# Patient Record
Sex: Female | Born: 1975 | Race: Black or African American | Hispanic: No | Marital: Single | Smoking: Never smoker
Health system: Southern US, Community
[De-identification: ages and names within clinical notes are randomized; demographics above are authoritative.]

---

## 1997-10-16 ENCOUNTER — Other Ambulatory Visit: Admission: RE | Admit: 1997-10-16 | Discharge: 1997-10-16 | Payer: Self-pay | Admitting: Obstetrics and Gynecology

## 1997-12-11 ENCOUNTER — Other Ambulatory Visit: Admission: RE | Admit: 1997-12-11 | Discharge: 1997-12-11 | Payer: Self-pay | Admitting: Obstetrics and Gynecology

## 1998-01-03 ENCOUNTER — Inpatient Hospital Stay (HOSPITAL_COMMUNITY): Admission: AD | Admit: 1998-01-03 | Discharge: 1998-01-05 | Payer: Self-pay | Admitting: Obstetrics and Gynecology

## 1999-07-15 ENCOUNTER — Other Ambulatory Visit: Admission: RE | Admit: 1999-07-15 | Discharge: 1999-07-15 | Payer: Self-pay | Admitting: *Deleted

## 1999-10-21 ENCOUNTER — Other Ambulatory Visit: Admission: RE | Admit: 1999-10-21 | Discharge: 1999-10-21 | Payer: Self-pay | Admitting: Family Medicine

## 1999-12-17 ENCOUNTER — Other Ambulatory Visit: Admission: RE | Admit: 1999-12-17 | Discharge: 1999-12-17 | Payer: Self-pay | Admitting: *Deleted

## 1999-12-18 ENCOUNTER — Other Ambulatory Visit: Admission: RE | Admit: 1999-12-18 | Discharge: 1999-12-18 | Payer: Self-pay | Admitting: *Deleted

## 1999-12-18 ENCOUNTER — Encounter (INDEPENDENT_AMBULATORY_CARE_PROVIDER_SITE_OTHER): Payer: Self-pay

## 2000-01-16 ENCOUNTER — Ambulatory Visit (HOSPITAL_COMMUNITY): Admission: RE | Admit: 2000-01-16 | Discharge: 2000-01-16 | Payer: Self-pay | Admitting: Obstetrics and Gynecology

## 2000-01-16 ENCOUNTER — Encounter (INDEPENDENT_AMBULATORY_CARE_PROVIDER_SITE_OTHER): Payer: Self-pay

## 2001-02-09 ENCOUNTER — Inpatient Hospital Stay (HOSPITAL_COMMUNITY): Admission: AD | Admit: 2001-02-09 | Discharge: 2001-02-09 | Payer: Self-pay | Admitting: Obstetrics

## 2001-03-08 ENCOUNTER — Encounter: Admission: RE | Admit: 2001-03-08 | Discharge: 2001-03-08 | Payer: Self-pay | Admitting: Obstetrics & Gynecology

## 2002-02-12 ENCOUNTER — Inpatient Hospital Stay (HOSPITAL_COMMUNITY): Admission: AD | Admit: 2002-02-12 | Discharge: 2002-02-12 | Payer: Self-pay | Admitting: Obstetrics and Gynecology

## 2002-10-13 ENCOUNTER — Other Ambulatory Visit: Admission: RE | Admit: 2002-10-13 | Discharge: 2002-10-13 | Payer: Self-pay | Admitting: Obstetrics & Gynecology

## 2003-03-05 ENCOUNTER — Inpatient Hospital Stay (HOSPITAL_COMMUNITY): Admission: AD | Admit: 2003-03-05 | Discharge: 2003-03-07 | Payer: Self-pay | Admitting: Obstetrics and Gynecology

## 2004-03-08 ENCOUNTER — Emergency Department (HOSPITAL_COMMUNITY): Admission: EM | Admit: 2004-03-08 | Discharge: 2004-03-08 | Payer: Self-pay | Admitting: *Deleted

## 2007-01-03 ENCOUNTER — Emergency Department (HOSPITAL_COMMUNITY): Admission: EM | Admit: 2007-01-03 | Discharge: 2007-01-03 | Payer: Self-pay | Admitting: Emergency Medicine

## 2008-12-03 ENCOUNTER — Emergency Department (HOSPITAL_COMMUNITY): Admission: EM | Admit: 2008-12-03 | Discharge: 2008-12-03 | Payer: Self-pay | Admitting: Family Medicine

## 2010-03-21 ENCOUNTER — Emergency Department (HOSPITAL_COMMUNITY): Admission: EM | Admit: 2010-03-21 | Discharge: 2010-03-21 | Payer: Self-pay | Admitting: Family Medicine

## 2010-09-04 LAB — URINE CULTURE: Culture  Setup Time: 209508270030

## 2010-09-04 LAB — POCT URINALYSIS DIPSTICK
Bilirubin Urine: NEGATIVE
Glucose, UA: NEGATIVE mg/dL
Ketones, ur: NEGATIVE mg/dL

## 2010-09-04 LAB — POCT PREGNANCY, URINE: Preg Test, Ur: NEGATIVE

## 2010-11-07 NOTE — Op Note (Signed)
[facility]  Patient:    NEIL, ERRICKSON                    MRN: [id number] Proc. Date: 01/16/00 Adm. Date:  [id number] Disc. Date: [id number] Attending:  Maxie Better                           Operative Report  PREOPERATIVE DIAGNOSIS:       Elective termination.  POSTOPERATIVE DIAGNOSIS:      Elective termination.  OPERATION:                    Suction dilation evacuation.  SURGEON:                      Sheronette A. Cherly Hensen, M.D.  ASSISTANT:  ANESTHESIA:                   IV sedation, paracervical block.  INDICATIONS:                  This is a 35 year old gravida 4, para 2-0-1-2 female.  Last menstrual period was November 28, 1999.  She is currently about eight weeks pregnant and desires termination of her pregnancy.  Risks and benefits of the procedure had been explained to the patient and consent was signed. The patient also desires to start Depo-Provera for contraception.  DESCRIPTION OF PROCEDURE:     Under adequate monitored anesthesia, the patient was placed in the dorsal lithotomy position. Examination under anesthesia revealed an anteverted uterus about eight to nine weeks size.  No adnexal masses are palpable.  The patient was sterilely prepped and draped in the usual fashion.  The bladder was catheterized with a small amount of urine. ________ speculum was placed in the vagina.  The cervix was noted to have a fishmouth appearance.  No vaginal lesions noted.  A ring forceps was used to grasp the anterior lip of the cervix.  Then 20 cc total of 1% Nesacaine was injected paracervically at the 3 and 9 oclock. The cervix was then serially dilated up to #31 [facility] dilator and a #8 suction curved cannula was introduced into the uterine cavity without difficulty.  A large amount of products of conception was obtained.  The uterine cavity was then curetted and resuctioned.  This procedure was performed until the uterine cavity was thought  to be clean and all instruments were then removed.  Specimen labeled products of conception were sent to pathology.  The estimated blood loss is minimal.  The maternal blood type is A+.  There were no complications. The patient tolerated the procedure well and was transferred to the recovery room in stable condition. DD:  01/16/00 TD:  01/19/00 Job: [id number]

## 2011-02-04 IMAGING — CR DG ANKLE COMPLETE 3+V*R*
3 series · 3 of 3 positions shown · non-contrast
Comparison: None

CLINICAL DATA: Swollen ankle/stepped on for a several weeks ago

RIGHT ANKLE - COMPLETE 3+ VIEW

[view not recorded (1 of 3)]
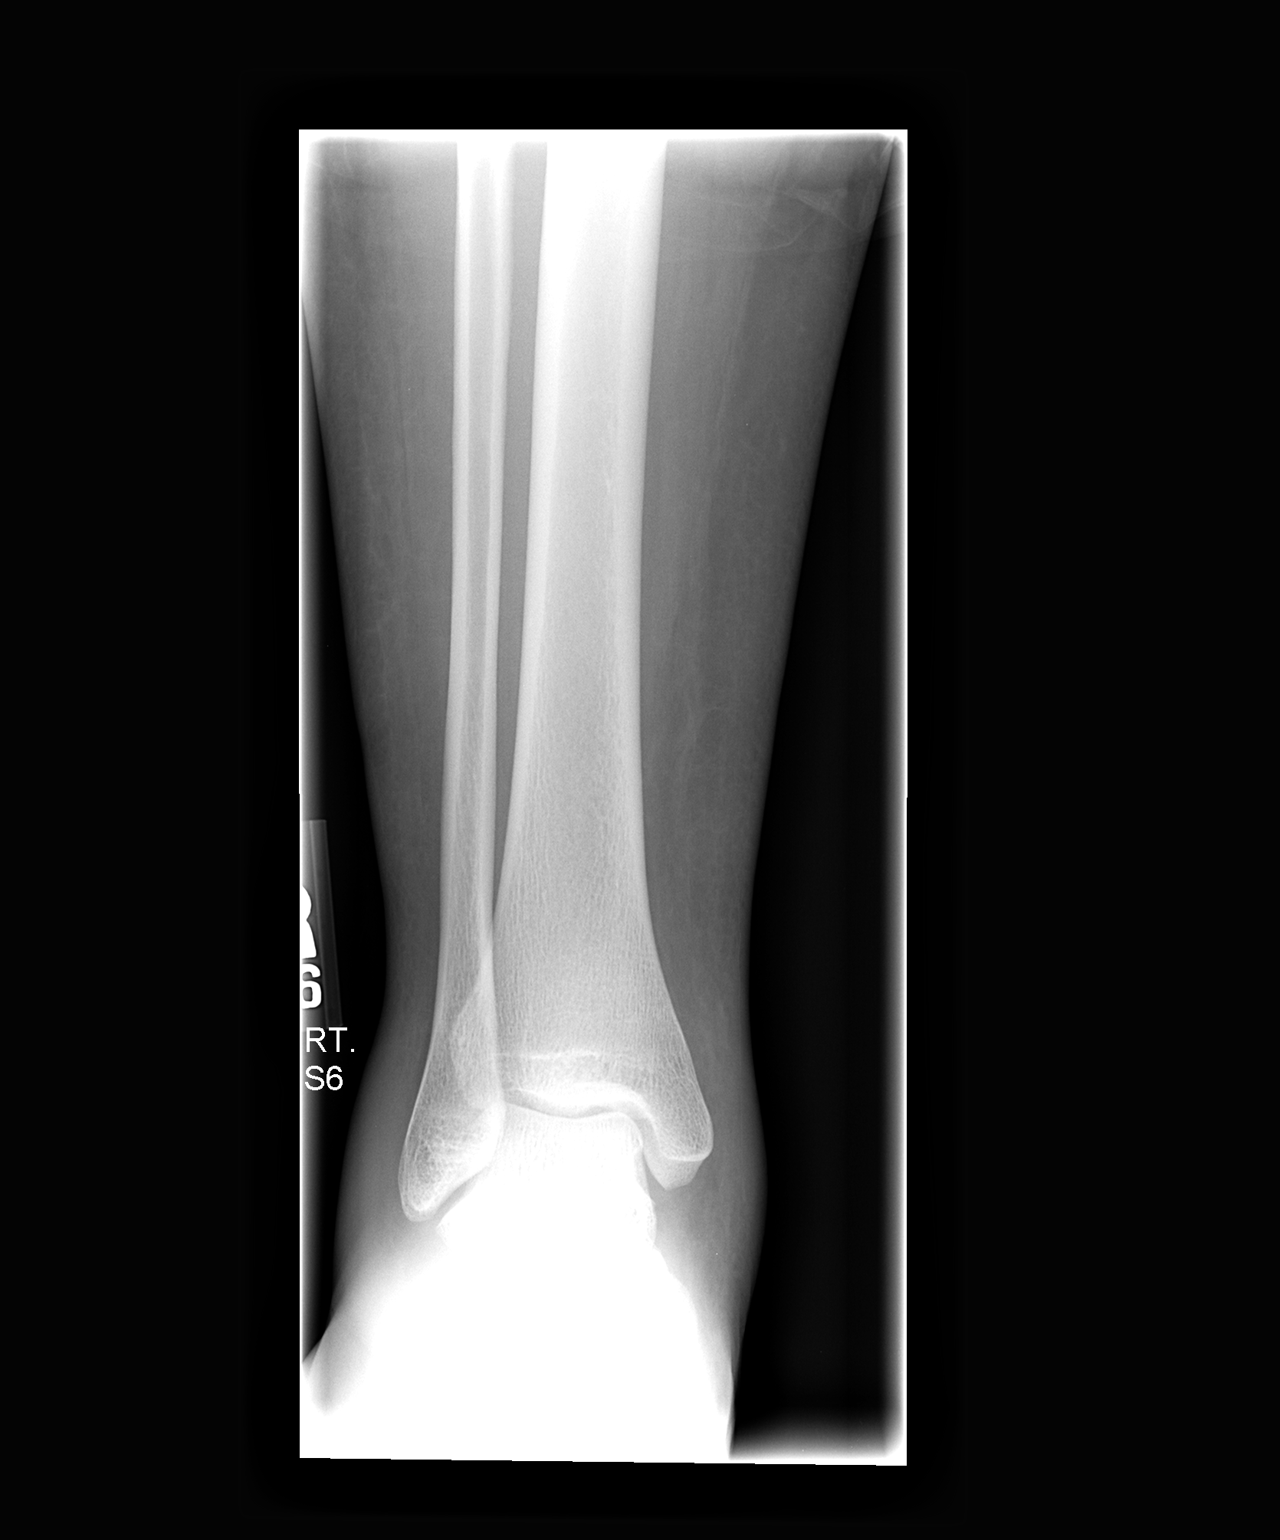

[view not recorded (2 of 3)]
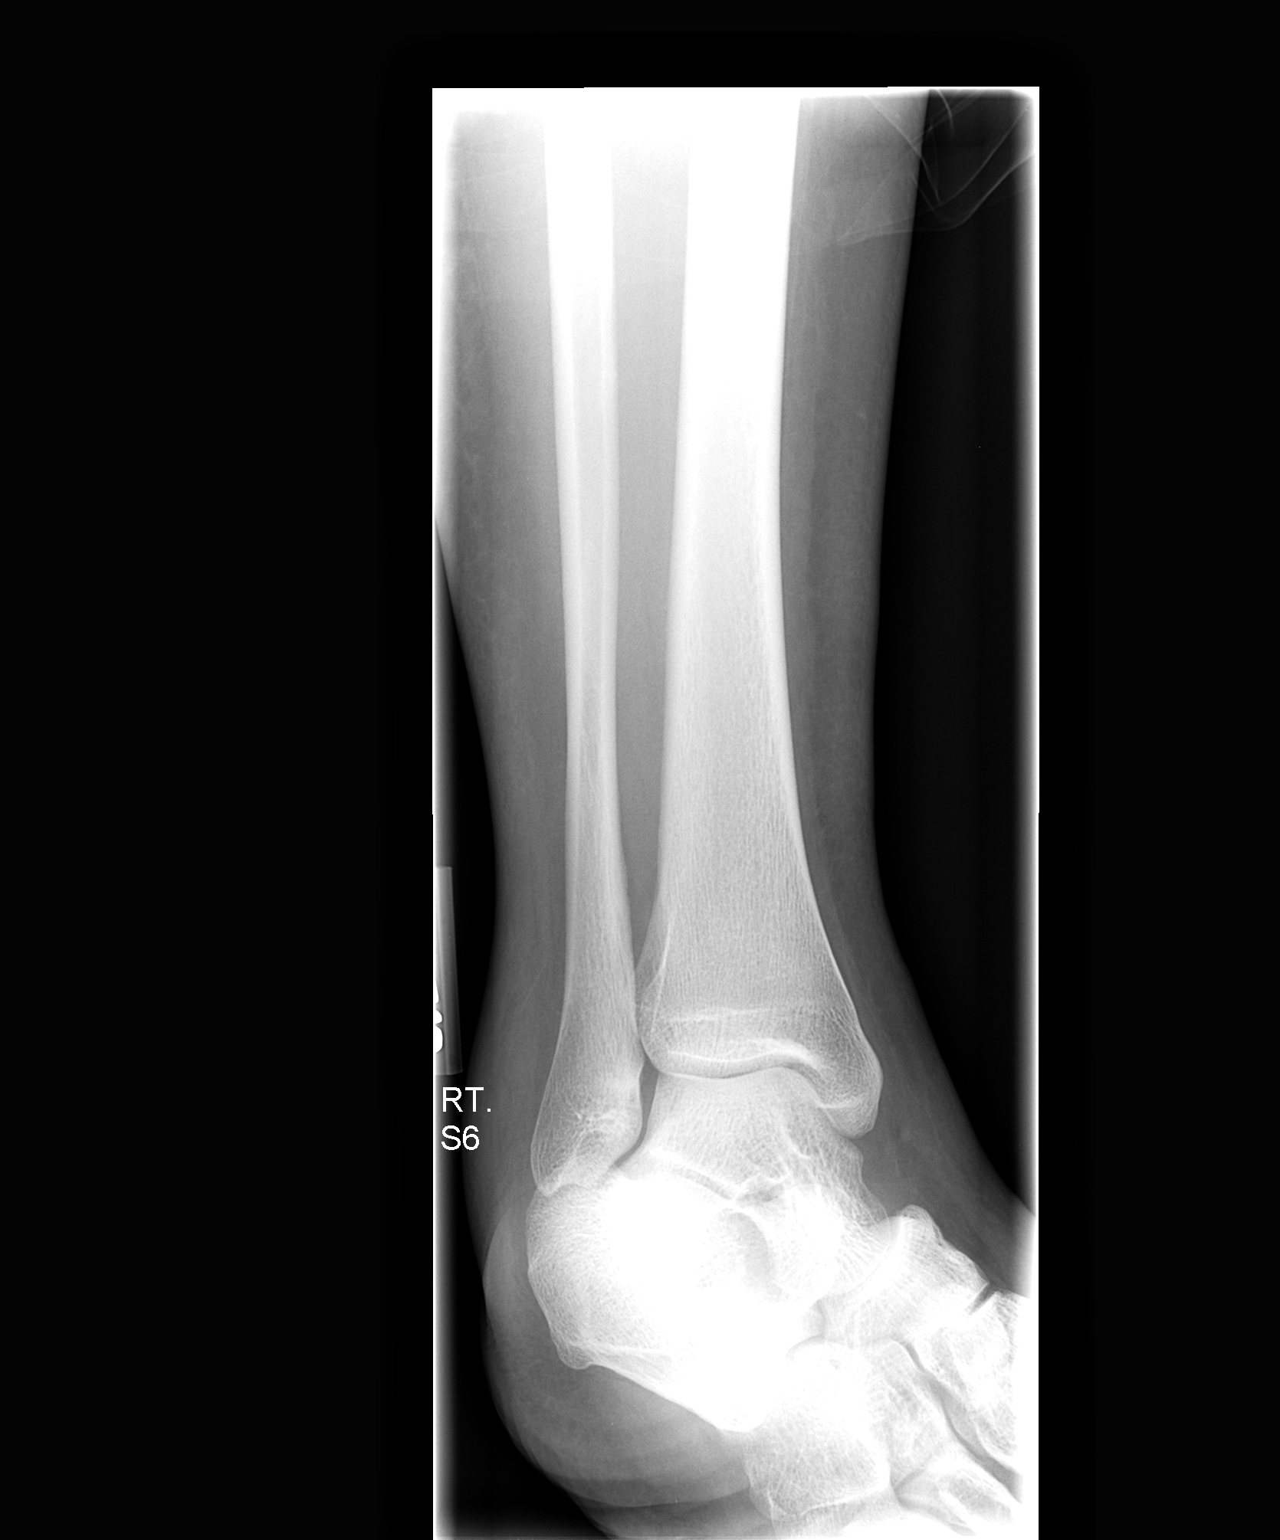

[view not recorded (3 of 3)]
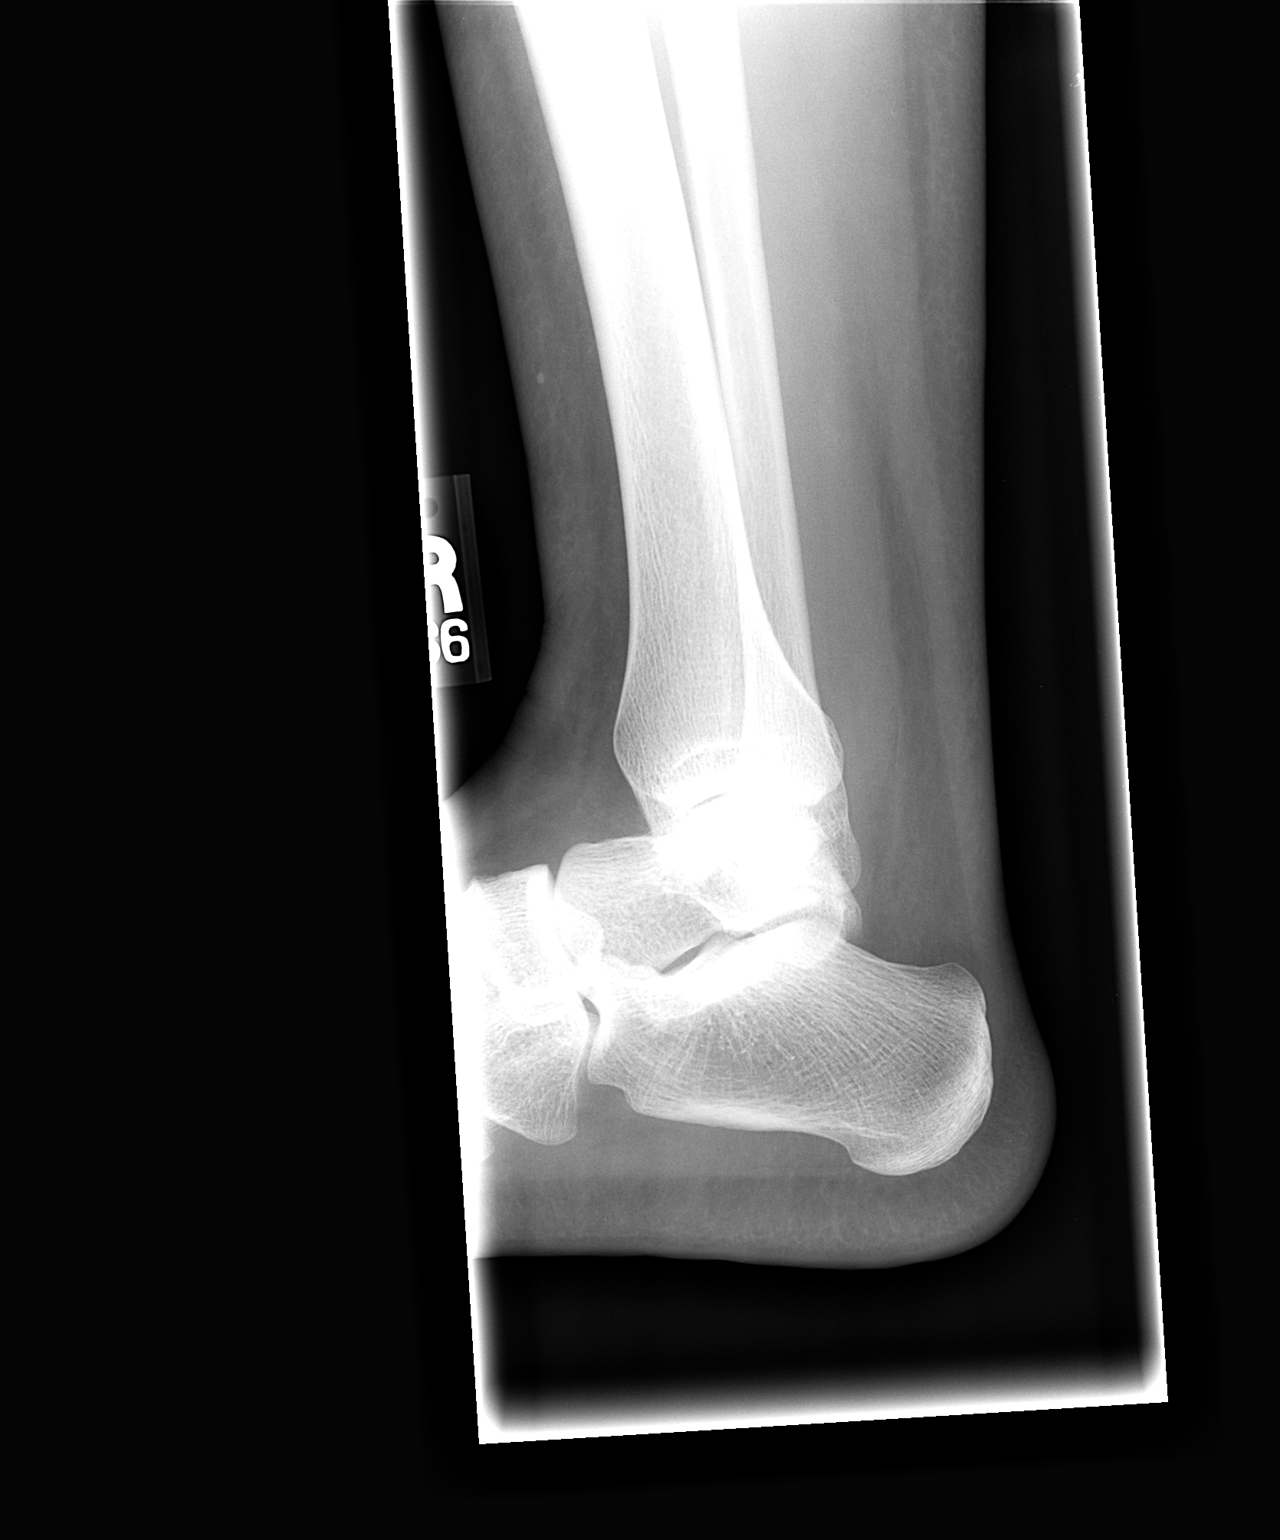

[3 of 3 positions shown; findings below may reference images not displayed]

FINDINGS: There is generalized soft tissue swelling with no gas or
foreign body.  Osseous structures and ankle joint normal.
IMPRESSION: Normal except for generalized soft tissue swelling.

## 2012-06-22 NOTE — L&D Delivery Note (Signed)
Attestation of Attending Supervision of Advanced Practitioner (PA/CNM/NP): Evaluation and management procedures were performed by the Advanced Practitioner under my supervision and collaboration.  I have reviewed the Advanced Practitioner's note and chart, and I agree with the management and plan.  Kayna Suppa, MD, FACOG Attending Obstetrician & Gynecologist Faculty Practice, [facility]  

## 2012-06-22 NOTE — L&D Delivery Note (Signed)
Delivery Note  At  0720 a viable female was delivered via  (Presentation: LOA ).  APGAR:9/9  ; weight pending.  10U pitocin was given IM.  The placenta separated spontaneously and delivered via CCT and maternal pushing effort.  It was inspected and appears to be intact with a 3 VC.  There were the following complications:   Anesthesia: none Episiotomy: none Lacerations: none Suture Repair: n/a Est. Blood Loss (mL): 300  Mom to postpartum.  Baby to Couplet care / Skin to Skin.  Delivery by Dr Corrin Parker under my supervision

## 2012-11-28 ENCOUNTER — Encounter (HOSPITAL_COMMUNITY): Payer: Self-pay | Admitting: Family

## 2012-11-28 LAB — OB RESULTS CONSOLE ABO/RH

## 2012-11-28 LAB — OB RESULTS CONSOLE GC/CHLAMYDIA
Chlamydia: NEGATIVE
Gonorrhea: NEGATIVE

## 2012-11-28 LAB — OB RESULTS CONSOLE RPR: RPR: NONREACTIVE

## 2012-11-28 LAB — OB RESULTS CONSOLE HIV ANTIBODY (ROUTINE TESTING): HIV: NONREACTIVE

## 2012-12-02 ENCOUNTER — Other Ambulatory Visit (HOSPITAL_COMMUNITY): Payer: Self-pay | Admitting: Family

## 2012-12-02 DIAGNOSIS — O09529 Supervision of elderly multigravida, unspecified trimester: Secondary | ICD-10-CM

## 2012-12-02 DIAGNOSIS — Z3682 Encounter for antenatal screening for nuchal translucency: Secondary | ICD-10-CM

## 2012-12-06 ENCOUNTER — Ambulatory Visit (HOSPITAL_COMMUNITY)
Admission: RE | Admit: 2012-12-06 | Discharge: 2012-12-06 | Disposition: A | Discharge status: Home / Self Care | Payer: Medicaid Other | Source: Ambulatory Visit | Attending: Family | Admitting: Family

## 2012-12-06 ENCOUNTER — Encounter (HOSPITAL_COMMUNITY): Payer: Self-pay

## 2012-12-06 ENCOUNTER — Other Ambulatory Visit: Payer: Self-pay

## 2012-12-06 VITALS — BP 106/63 | HR 97 | Wt 176.0 lb

## 2012-12-06 DIAGNOSIS — Z3682 Encounter for antenatal screening for nuchal translucency: Secondary | ICD-10-CM

## 2012-12-06 DIAGNOSIS — O09529 Supervision of elderly multigravida, unspecified trimester: Secondary | ICD-10-CM

## 2012-12-06 DIAGNOSIS — O09521 Supervision of elderly multigravida, first trimester: Secondary | ICD-10-CM

## 2012-12-06 DIAGNOSIS — Z3689 Encounter for other specified antenatal screening: Secondary | ICD-10-CM | POA: Insufficient documentation

## 2012-12-06 DIAGNOSIS — O351XX0 Maternal care for (suspected) chromosomal abnormality in fetus, not applicable or unspecified: Secondary | ICD-10-CM | POA: Insufficient documentation

## 2012-12-06 DIAGNOSIS — O3510X0 Maternal care for (suspected) chromosomal abnormality in fetus, unspecified, not applicable or unspecified: Secondary | ICD-10-CM | POA: Insufficient documentation

## 2012-12-06 NOTE — Progress Notes (Signed)
Genetic Counseling  High-Risk Gestation Note  Appointment Date:  12/06/2012 Referred By: Jerrell Belfast, FNP Date of Birth:  06-30-1975     Pregnancy History: [id number] Estimated Date of Delivery: 06/17/13 Estimated Gestational Age: [redacted]w[redacted]d Attending: Particia Nearing, MD   Kylie Harvey was seen for genetic counseling because of a maternal age of 37 y.o.. She was accompanied by her friend today.      She was counseled regarding maternal age and the association with risk for chromosome conditions due to nondisjunction with aging of the ova.   We reviewed chromosomes, nondisjunction, and the associated 1 in 114 risk for fetal aneuploidy related to a maternal age of 37 y.o. at [redacted]w[redacted]d gestation.  She was counseled that the risk for aneuploidy decreases as gestational age increases, accounting for those pregnancies which spontaneously abort.  We specifically discussed Down syndrome (trisomy 30), trisomies 66 and 55, and sex chromosome aneuploidies (47,XXX and 47,XXY) including the common features and prognoses of each.   We reviewed available screening options including First Screen, Quad screen, noninvasive prenatal screening (NIPS)/cell free fetal DNA (cffDNA) testing, and detailed ultrasound.  She was counseled that screening tests are used to modify a patient's a priori risk for aneuploidy, typically based on age. This estimate provides a pregnancy specific risk assessment. We reviewed the benefits and limitations of each option. Specifically, we discussed the conditions for which each test screens, the detection rates, and false positive rates of each. She was also counseled regarding diagnostic testing via CVS and amniocentesis. We reviewed the approximate 1 in 100 risk for complications for CVS and the approximate 1 in 300-500 risk for complications for amniocentesis, including spontaneous pregnancy loss for both. After consideration of all the options, she elected to proceed with NIPS (Panorama)  today.  Those results will be available in 8-10 days.    She also expressed interest in pursuing a nuchal translucency ultrasound, which was performed today.  The report will be documented separately.  The patient would like to return for a detailed ultrasound at ~18+ weeks gestation.  This appointment was scheduled today. She understands that screening tests cannot rule out all birth defects or genetic syndromes. The patient was advised of this limitation and states she still does not want additional testing at this time.   Kylie Harvey was provided with written information regarding sickle cell anemia (SCA) including the carrier frequency and incidence in the African-American population, the availability of carrier testing and prenatal diagnosis if indicated.  In addition, we discussed that hemoglobinopathies are routinely screened for as part of the [place] newborn screening panel.  She reported that she thinks this screening was performed at a previous OB visit but does not have results yet. We reviewed that screening via hemoglobin electrophoresis would be available, if this has not been previously performed.   Both family histories were reviewed and found to be contributory for a maternal aunt to the father of the pregnancy with mental retardation. She was described to have absent speech. 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 She reportedly does not have physical differences from relatives. Kylie Harvey was counseled that there are many different causes of intellectual disabilities including environmental, multifactorial, and genetic etiologies.  We discussed that a specific diagnosis for intellectual disability can be determined in approximately 50% of these individuals.  In the remaining 50% of individuals, a diagnosis may never be determined.  Regarding genetic causes, we discussed that chromosome aberrations  (aneuploidy, deletions, duplications, insertions, and translocations) are responsible for a small percentage of individuals with intellectual disability.  Many individuals with chromosome aberrations have additional differences, including congenital anomalies or minor dysmorphisms.  Likewise, single gene conditions are the underlying cause of intellectual delay in some families.  We discussed that many gene conditions have intellectual disability as a feature, but also often include other physical or medical differences. However, we discussed that if this relative's intellectual disability was secondary to complications from a twin pregnancy, then recurrence risk for relatives would be expected to be low. We discussed that without more specific information, it is difficult to provide an accurate risk assessment.  Further genetic counseling is warranted if more information is obtained.  Kylie Harvey denied exposure to environmental toxins or chemical agents. She reported drinking alcohol prior to being aware of the pregnancy. The patient reported that she discovered the pregnancy on 11/02/12. She reported having two glasses of wine on 12/04/12 and no additional alcohol exposure.  Prenatal alcohol exposure can increase the risk for growth delays, small head size, heart defects, eye and facial differences, as well as behavior problems and learning disabilities. The risk of these to occur tends to increase with the amount of alcohol consumed. However, because there is no identified safe amount of alcohol in pregnancy, it is recommended to completely avoid alcohol in pregnancy. Given the reported amount of exposure, risk for associated effects are likely low in the current pregnancy. Additionally the patient reported smoking previously in pregnancy and discontinued smoking, once she became aware of the pregnancy on 11/02/12.  She denied significant viral illnesses during the course of her pregnancy. Her medical  and surgical histories were noncontributory.   I counseled Kylie Harvey regarding the above risks and available options.  The approximate face-to-face time with the genetic counselor was 40 minutes.  Quinn Plowman, MS,  Certified Genetic Counselor 12/06/2012

## 2012-12-15 ENCOUNTER — Telehealth (HOSPITAL_COMMUNITY): Payer: Self-pay | Admitting: MS"

## 2012-12-15 NOTE — Telephone Encounter (Signed)
Called Kylie Harvey to discuss her cell free fetal DNA test results.  Mrs. Kylie Harvey had Panorama testing through [facility].  Testing was offered because of advanced maternal age.   The patient was identified by name and DOB.  We reviewed that these are within normal limits, showing a less than 1 in 10,000 risk for trisomies 21, 18 and 13, and monosomy X (Turner syndrome).  In addition, the risk for triploidy/vanishing twin and sex chromosome trisomies (47,XXX and 47,XXY) was also low risk.  We reviewed that this testing identifies > 99% of pregnancies with trisomy 47, trisomy 95, trisomy 59, sex chromosome trisomies (47,XXX and 47,XXY), and triploidy.  The detection rate for monosomy X is ~92%.  The false positive rate is <0.1% for all conditions. Testing was also consistent with female gender.  The patient did wish to know gender.  She understands that this testing does not identify all genetic conditions.  All questions were answered to her satisfaction, she was encouraged to call with additional questions or concerns.  Quinn Plowman, MS Certified Genetic Counselor 12/15/2012 10:15 AM

## 2013-01-13 ENCOUNTER — Ambulatory Visit (HOSPITAL_COMMUNITY): Payer: Medicaid Other

## 2013-01-20 ENCOUNTER — Encounter (HOSPITAL_COMMUNITY): Payer: Self-pay

## 2013-01-20 ENCOUNTER — Ambulatory Visit (HOSPITAL_COMMUNITY)
Admission: RE | Admit: 2013-01-20 | Discharge: 2013-01-20 | Disposition: A | Discharge status: Home / Self Care | Payer: Medicaid Other | Source: Ambulatory Visit | Attending: Family | Admitting: Family

## 2013-01-20 DIAGNOSIS — O358XX0 Maternal care for other (suspected) fetal abnormality and damage, not applicable or unspecified: Secondary | ICD-10-CM | POA: Insufficient documentation

## 2013-01-20 DIAGNOSIS — Z363 Encounter for antenatal screening for malformations: Secondary | ICD-10-CM | POA: Insufficient documentation

## 2013-01-20 DIAGNOSIS — O09529 Supervision of elderly multigravida, unspecified trimester: Secondary | ICD-10-CM | POA: Insufficient documentation

## 2013-01-20 DIAGNOSIS — O09521 Supervision of elderly multigravida, first trimester: Secondary | ICD-10-CM

## 2013-01-20 DIAGNOSIS — Z1389 Encounter for screening for other disorder: Secondary | ICD-10-CM | POA: Insufficient documentation

## 2013-01-20 NOTE — Progress Notes (Signed)
Kylie Harvey  was seen today for an ultrasound appointment.  See full report in [facility].  Impression: Single IUP at 18 0/7 weeks Advanced maternal age - NIPS (cell free fetal DNA) low risk for aneuploidy Normal detailed fetal anatomy No markers associated with aneuploidy noted Normal amniotic fluid volume  Recommendations: Follow-up ultrasounds as clinically indicated.   Alpha Gula, MD

## 2013-04-28 LAB — OB RESULTS CONSOLE RPR: RPR: NONREACTIVE

## 2013-05-12 ENCOUNTER — Other Ambulatory Visit (HOSPITAL_COMMUNITY): Payer: Self-pay | Admitting: Family

## 2013-05-12 DIAGNOSIS — O09523 Supervision of elderly multigravida, third trimester: Secondary | ICD-10-CM

## 2013-05-19 ENCOUNTER — Ambulatory Visit (HOSPITAL_COMMUNITY): Admission: RE | Admit: 2013-05-19 | Payer: Medicaid Other | Source: Ambulatory Visit

## 2013-05-19 ENCOUNTER — Ambulatory Visit (HOSPITAL_COMMUNITY)
Admission: RE | Admit: 2013-05-19 | Discharge: 2013-05-19 | Disposition: A | Discharge status: Home / Self Care | Payer: Medicaid Other | Source: Ambulatory Visit | Attending: Family | Admitting: Family

## 2013-05-19 DIAGNOSIS — O26849 Uterine size-date discrepancy, unspecified trimester: Secondary | ICD-10-CM | POA: Insufficient documentation

## 2013-05-19 DIAGNOSIS — O09523 Supervision of elderly multigravida, third trimester: Secondary | ICD-10-CM

## 2013-05-19 DIAGNOSIS — O09529 Supervision of elderly multigravida, unspecified trimester: Secondary | ICD-10-CM | POA: Insufficient documentation

## 2013-06-09 LAB — OB RESULTS CONSOLE GBS: GBS: NEGATIVE

## 2013-06-12 ENCOUNTER — Encounter (HOSPITAL_COMMUNITY): Payer: Self-pay

## 2013-06-12 ENCOUNTER — Inpatient Hospital Stay (HOSPITAL_COMMUNITY)
Admission: AD | Admit: 2013-06-12 | Discharge: 2013-06-12 | Disposition: A | Discharge status: Home / Self Care | Payer: Medicaid Other | Source: Ambulatory Visit | Attending: Obstetrics & Gynecology | Admitting: Obstetrics & Gynecology

## 2013-06-12 DIAGNOSIS — O479 False labor, unspecified: Secondary | ICD-10-CM | POA: Insufficient documentation

## 2013-06-12 NOTE — Progress Notes (Signed)
Kylie Harvey cnm notified of negative fern, cervical exam, ctx pattern and tracing. Order to discharge home

## 2013-06-12 NOTE — MAU Note (Signed)
Patient states she went to the BR at 0400 this am and after urinating continued to have a little leaking. States it happened another time today but no active leaking and not wearing a pad. Denies bleeding and reports good fetal movement. Having some irregular contractions.

## 2013-06-14 ENCOUNTER — Inpatient Hospital Stay (HOSPITAL_COMMUNITY)
Admission: AD | Admit: 2013-06-14 | Discharge: 2013-06-15 | DRG: 775 | Disposition: A | Discharge status: Home / Self Care | Payer: Medicaid Other | Source: Ambulatory Visit | Attending: Obstetrics & Gynecology | Admitting: Obstetrics & Gynecology

## 2013-06-14 ENCOUNTER — Encounter (HOSPITAL_COMMUNITY): Payer: Self-pay | Admitting: *Deleted

## 2013-06-14 DIAGNOSIS — IMO0001 Reserved for inherently not codable concepts without codable children: Secondary | ICD-10-CM

## 2013-06-14 DIAGNOSIS — O34219 Maternal care for unspecified type scar from previous cesarean delivery: Secondary | ICD-10-CM | POA: Diagnosis present

## 2013-06-14 DIAGNOSIS — O09529 Supervision of elderly multigravida, unspecified trimester: Secondary | ICD-10-CM | POA: Diagnosis present

## 2013-06-14 LAB — CBC
HCT: 32.7 % — ABNORMAL LOW (ref 36.0–46.0)
Hemoglobin: 11.6 g/dL — ABNORMAL LOW (ref 12.0–15.0)
MCH: 32.1 pg (ref 26.0–34.0)
MCHC: 35.5 g/dL (ref 30.0–36.0)
MCV: 90.6 fL (ref 78.0–100.0)
Platelets: 243 10*3/uL (ref 150–400)
RBC: 3.61 MIL/uL — ABNORMAL LOW (ref 3.87–5.11)
WBC: 7.2 10*3/uL (ref 4.0–10.5)

## 2013-06-14 LAB — TYPE AND SCREEN
ABO/RH(D): A POS
Antibody Screen: NEGATIVE

## 2013-06-14 LAB — ABO/RH: ABO/RH(D): A POS

## 2013-06-14 MED ORDER — TETANUS-DIPHTH-ACELL PERTUSSIS 5-2.5-18.5 LF-MCG/0.5 IM SUSP: 0.5000 mL | Freq: Once | INTRAMUSCULAR | Status: DC

## 2013-06-14 MED ORDER — WITCH HAZEL-GLYCERIN EX PADS: 1.0000 "application " | MEDICATED_PAD | CUTANEOUS | Status: DC | PRN

## 2013-06-14 MED ORDER — FLEET ENEMA 7-19 GM/118ML RE ENEM: 1.0000 | ENEMA | RECTAL | Status: DC | PRN

## 2013-06-14 MED ORDER — MEASLES, MUMPS & RUBELLA VAC ~~LOC~~ INJ
0.5000 mL | INJECTION | Freq: Once | SUBCUTANEOUS | Status: DC
Start: 2013-06-15 — End: 2013-06-15
  Filled 2013-06-14: qty 0.5

## 2013-06-14 MED ORDER — CITRIC ACID-SODIUM CITRATE 334-500 MG/5ML PO SOLN: 30.0000 mL | ORAL | Status: DC | PRN

## 2013-06-14 MED ORDER — SIMETHICONE 80 MG PO CHEW: 80.0000 mg | CHEWABLE_TABLET | ORAL | Status: DC | PRN

## 2013-06-14 MED ORDER — ONDANSETRON HCL 4 MG/2ML IJ SOLN: 4.0000 mg | INTRAMUSCULAR | Status: DC | PRN

## 2013-06-14 MED ORDER — IBUPROFEN 600 MG PO TABS
600.0000 mg | ORAL_TABLET | Freq: Four times a day (QID) | ORAL | Status: DC | PRN
  Administered 2013-06-14: 600 mg via ORAL
  Filled 2013-06-14: qty 1

## 2013-06-14 MED ORDER — ACETAMINOPHEN 325 MG PO TABS: 650.0000 mg | ORAL_TABLET | ORAL | Status: DC | PRN

## 2013-06-14 MED ORDER — OXYTOCIN 40 UNITS IN LACTATED RINGERS INFUSION - SIMPLE MED: 62.5000 mL/h | INTRAVENOUS | Status: DC

## 2013-06-14 MED ORDER — DIPHENHYDRAMINE HCL 25 MG PO CAPS: 25.0000 mg | ORAL_CAPSULE | Freq: Four times a day (QID) | ORAL | Status: DC | PRN

## 2013-06-14 MED ORDER — OXYCODONE-ACETAMINOPHEN 5-325 MG PO TABS
1.0000 | ORAL_TABLET | ORAL | Status: DC | PRN
  Administered 2013-06-14 – 2013-06-15 (×2): 1 via ORAL
  Filled 2013-06-14 (×2): qty 1

## 2013-06-14 MED ORDER — SENNOSIDES-DOCUSATE SODIUM 8.6-50 MG PO TABS
2.0000 | ORAL_TABLET | ORAL | Status: DC
  Administered 2013-06-14: 2 via ORAL
  Filled 2013-06-14: qty 2

## 2013-06-14 MED ORDER — LANOLIN HYDROUS EX OINT: TOPICAL_OINTMENT | CUTANEOUS | Status: DC | PRN

## 2013-06-14 MED ORDER — ZOLPIDEM TARTRATE 5 MG PO TABS: 5.0000 mg | ORAL_TABLET | Freq: Every evening | ORAL | Status: DC | PRN

## 2013-06-14 MED ORDER — DIBUCAINE 1 % RE OINT: 1.0000 "application " | TOPICAL_OINTMENT | RECTAL | Status: DC | PRN

## 2013-06-14 MED ORDER — OXYTOCIN BOLUS FROM INFUSION: 500.0000 mL | INTRAVENOUS | Status: DC

## 2013-06-14 MED ORDER — OXYCODONE-ACETAMINOPHEN 5-325 MG PO TABS
1.0000 | ORAL_TABLET | ORAL | Status: DC | PRN
  Administered 2013-06-14: 1 via ORAL
  Filled 2013-06-14: qty 1

## 2013-06-14 MED ORDER — OXYTOCIN 40 UNITS IN LACTATED RINGERS INFUSION - SIMPLE MED: 62.5000 mL/h | INTRAVENOUS | Status: DC | PRN

## 2013-06-14 MED ORDER — ONDANSETRON HCL 4 MG/2ML IJ SOLN: 4.0000 mg | Freq: Four times a day (QID) | INTRAMUSCULAR | Status: DC | PRN

## 2013-06-14 MED ORDER — PRENATAL MULTIVITAMIN CH
1.0000 | ORAL_TABLET | Freq: Every day | ORAL | Status: DC
  Administered 2013-06-14 – 2013-06-15 (×2): 1 via ORAL
  Filled 2013-06-14 (×2): qty 1

## 2013-06-14 MED ORDER — METHYLERGONOVINE MALEATE 0.2 MG PO TABS: 0.2000 mg | ORAL_TABLET | ORAL | Status: DC | PRN

## 2013-06-14 MED ORDER — ONDANSETRON HCL 4 MG PO TABS: 4.0000 mg | ORAL_TABLET | ORAL | Status: DC | PRN

## 2013-06-14 MED ORDER — LACTATED RINGERS IV SOLN: INTRAVENOUS | Status: DC

## 2013-06-14 MED ORDER — OXYTOCIN 10 UNIT/ML IJ SOLN: 10.0000 [IU] | Freq: Once | INTRAMUSCULAR | Status: DC

## 2013-06-14 MED ORDER — LACTATED RINGERS IV SOLN: 500.0000 mL | INTRAVENOUS | Status: DC | PRN

## 2013-06-14 MED ORDER — BENZOCAINE-MENTHOL 20-0.5 % EX AERO
1.0000 "application " | INHALATION_SPRAY | CUTANEOUS | Status: DC | PRN
  Filled 2013-06-14: qty 56

## 2013-06-14 MED ORDER — FERROUS SULFATE 325 (65 FE) MG PO TABS
325.0000 mg | ORAL_TABLET | Freq: Two times a day (BID) | ORAL | Status: DC
  Administered 2013-06-14 – 2013-06-15 (×2): 325 mg via ORAL
  Filled 2013-06-14 (×2): qty 1

## 2013-06-14 MED ORDER — OXYTOCIN 10 UNIT/ML IJ SOLN
INTRAMUSCULAR | Status: AC
  Administered 2013-06-14: 10 [IU]
  Filled 2013-06-14: qty 2

## 2013-06-14 MED ORDER — METHYLERGONOVINE MALEATE 0.2 MG/ML IJ SOLN: 0.2000 mg | INTRAMUSCULAR | Status: DC | PRN

## 2013-06-14 MED ORDER — BISACODYL 10 MG RE SUPP: 10.0000 mg | Freq: Every day | RECTAL | Status: DC | PRN

## 2013-06-14 MED ORDER — IBUPROFEN 600 MG PO TABS
600.0000 mg | ORAL_TABLET | Freq: Four times a day (QID) | ORAL | Status: DC
  Administered 2013-06-14 – 2013-06-15 (×5): 600 mg via ORAL
  Filled 2013-06-14 (×5): qty 1

## 2013-06-14 MED ORDER — LIDOCAINE HCL (PF) 1 % IJ SOLN
30.0000 mL | INTRAMUSCULAR | Status: DC | PRN
  Filled 2013-06-14: qty 30

## 2013-06-14 MED ORDER — FLEET ENEMA 7-19 GM/118ML RE ENEM: 1.0000 | ENEMA | Freq: Every day | RECTAL | Status: DC | PRN

## 2013-06-14 NOTE — H&P (Signed)
Kylie Harvey is a 37 y.o. female [phone] with IUP at [redacted]w[redacted]d presenting for contractions startigng a fe whours ago. active fetal movement.   PNCare at [facility] since 11 wks  Prenatal History/Complications: none Past Medical History: No past medical history on file.  Past Surgical History: No past surgical history on file.  Obstetrical History: OB History   Grav Para Term Preterm Abortions TAB SAB Ect Mult Living   5    1  1   3       Social History: History   Social History  . Marital Status: Single    Spouse Name: N/A    Number of Children: N/A  . Years of Education: N/A   Social History Main Topics  . Smoking status: Never Smoker   . Smokeless tobacco: Not on file  . Alcohol Use: Not on file  . Drug Use: Yes    Special: Marijuana     Comment: +uds for marijuana 11/28/2012. patient states that she have stopped  . Sexual Activity: Not Currently    Birth Control/ Protection: None   Other Topics Concern  . Not on file   Social History Narrative  . No narrative on file    Family History: No family history on file.  Allergies: No Known Allergies  Prescriptions prior to admission  Medication Sig Dispense Refill  . Prenatal Vit-Fe Fumarate-FA (PRENATAL MULTIVITAMIN) TABS Take 1 tablet by mouth daily.          Review of Systems   Constitutional: Negative for fever, chills, weight loss, malaise/fatigue and diaphoresis.  HENT: Negative for hearing loss, ear pain, nosebleeds, congestion, sore throat, neck pain, tinnitus and ear discharge.   Eyes: Negative for blurred vision, double vision, photophobia, pain, discharge and redness.  Respiratory: Negative for cough, hemoptysis, sputum production, shortness of breath, wheezing and stridor.   Cardiovascular: Negative for chest pain, palpitations, orthopnea,  leg swelling  Gastrointestinal: Positive for abdominal pain. Negative for heartburn, nausea, vomiting, diarrhea, constipation, blood in stool Genitourinary: Negative  for dysuria, urgency, frequency, hematuria and flank pain.  Musculoskeletal: Negative for myalgias, back pain, joint pain and falls.  Skin: Negative for itching and rash.  Neurological: Negative for dizziness, tingling, tremors, sensory change, speech change, focal weakness, seizures, loss of consciousness, weakness and headaches.  Endo/Heme/Allergies: Negative for environmental allergies and polydipsia. Does not bruise/bleed easily.  Psychiatric/Behavioral: Negative for depression, suicidal ideas, hallucinations, memory loss and substance abuse. The patient is not nervous/anxious and does not have insomnia.       Last menstrual period 09/10/2012. General appearance: alert, cooperative and mild distress Lungs: clear to auscultation bilaterally Heart: regular rate and rhythm Abdomen: soft, non-tender; bowel sounds normal Extremities: Homans sign is negative, no sign of DVT DTR's 2+ Presentation: cephalic Fetal monitoringdoppler 130's, variable decels Uterine activity q2 mins C/C/Station: +1;+2 SROM in parking lot, clear fluid  Prenatal labs: ABO, Rh:   A+ Antibody:  neg Rubella:  imm RPR:   neg HBsAg:   neg HIV:   neg GBS:   neg 1 hr Glucola 131 Genetic screening  declined Anatomy US normal   No results found for this or any previous visit (from the past 24 hour(s)).  Assessment: Kylie Harvey is a 37 y.o. [id number] with an IUP at [redacted]w[redacted]d presenting for active labor  Plan: deliver   CRESENZO-DISHMAN,Kylie Harvey 06/14/2013, 7:17 AM

## 2013-06-14 NOTE — Progress Notes (Signed)
Pt arrived to room via stretcher. cnm at bs w/resident. Pt calm in control.

## 2013-06-14 NOTE — MAU Note (Signed)
Pt G5 P3 at 38.5wks leaking clear fluid x 30 min and having contractions.

## 2013-06-15 MED ORDER — IBUPROFEN 600 MG PO TABS: 600.0000 mg | ORAL_TABLET | Freq: Four times a day (QID) | ORAL | Status: DC

## 2013-06-15 NOTE — Discharge Summary (Signed)
Obstetric Discharge Summary Reason for Admission: onset of labor and rupture of membranes Prenatal Procedures: none Intrapartum Procedures: spontaneous vaginal delivery Postpartum Procedures: none Complications-Operative and Postpartum: none Hemoglobin  Date Value Range Status  06/14/2013 11.6* 12.0 - 15.0 g/dL Final     HCT  Date Value Range Status  06/14/2013 32.7* 36.0 - 46.0 % Final   Brief Hospital Course Pt was admitted on 06/14/13 for onset of labor and rupture of membranes. She progressed well and delivered a healthy female via NSVD over intact perineum. No complications postpartum. On day of discharge, she reports ambulating well, tolerating PO, urinating well, passing gas and her pain is well controlled. Her bleeding is equal to menstrual flow and decreasing. She is considering nexplanon or IUD for contraception. Breastfeeding is going well.  Physical Exam:  General: alert, cooperative and no distress Lochia: appropriate Uterine Fundus: firm Incision: n/a DVT Evaluation: No evidence of DVT seen on physical exam. Negative Homan's sign. No cords or calf tenderness. No significant calf/ankle edema.  Discharge Diagnoses: Term Pregnancy-delivered  Discharge Information: Date: 06/15/2013 Activity: pelvic rest Diet: routine Medications: PNV and Ibuprofen Condition: stable Instructions: refer to practice specific booklet Discharge to: home Follow-up Information   Follow up with [facility] [place] In 5 weeks. (Call as soon as possible to make appointment for postpartum visit in 4-6 weeks)    Contact information:   [address] [place] [address] [phone]      Newborn Data: Live born female  Birth Weight: 7 lb 3.3 oz (3269 g) APGAR: 9, 9 Parents desire outpatient circumcision. Home with mother.  Pior, Jearld Lesch 06/15/2013, 8:28 AM  I have seen and examined this patient and agree with above documentation in the resident's note.   Rulon Abide,  M.D. [facility] Fellow 06/15/2013 8:48 AM

## 2013-06-16 NOTE — Discharge Summary (Signed)
Attestation of Attending Supervision of Fellow: Evaluation and management procedures were performed by the Fellow under my supervision and collaboration.  I have reviewed the Fellow's note and chart, and I agree with the management and plan.    

## 2013-06-19 NOTE — H&P (Signed)
Attestation of Attending Supervision of Advanced Practitioner (PA/CNM/NP): Evaluation and management procedures were performed by the Advanced Practitioner under my supervision and collaboration.  I have reviewed the Advanced Practitioner's note and chart, and I agree with the management and plan.  Dakai Braithwaite, MD, FACOG Attending Obstetrician & Gynecologist Faculty Practice, [facility]  

## 2013-06-19 NOTE — Progress Notes (Signed)
Post discharge chart review completed.  

## 2014-04-23 ENCOUNTER — Encounter (HOSPITAL_COMMUNITY): Payer: Self-pay | Admitting: *Deleted

## 2015-07-21 IMAGING — US US OB FOLLOW-UP
1 series · 12 of 28 positions shown · non-contrast
Comparison: none

[Series 1: us ob follow up · 12 of 41 slices shown]
[im 2/41]
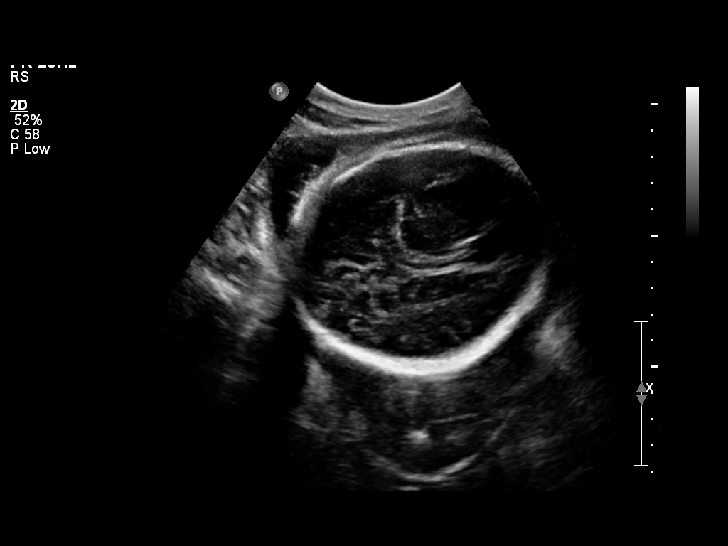
[im 5/41]
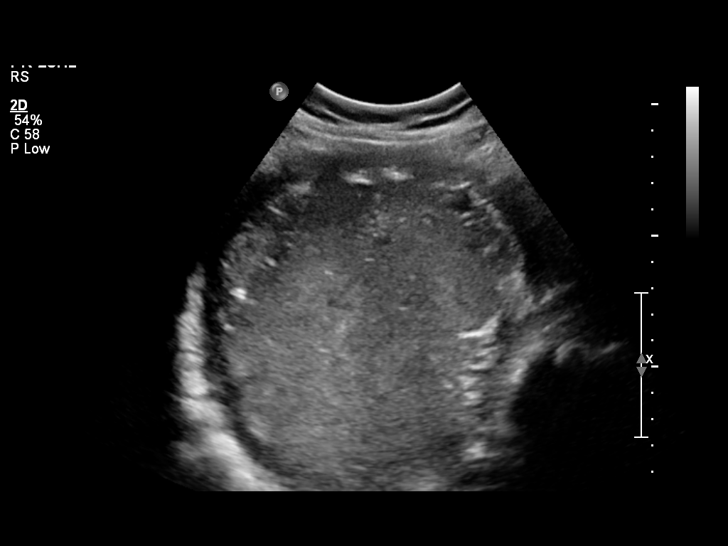
[im 8/41]
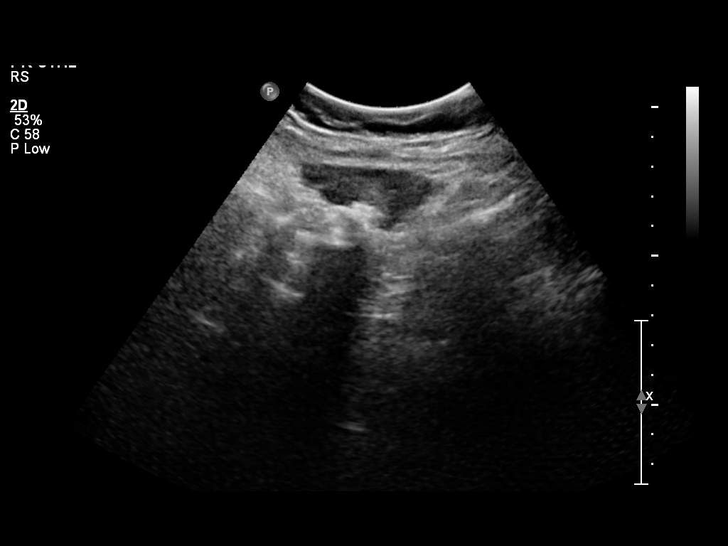
[im 12/41]
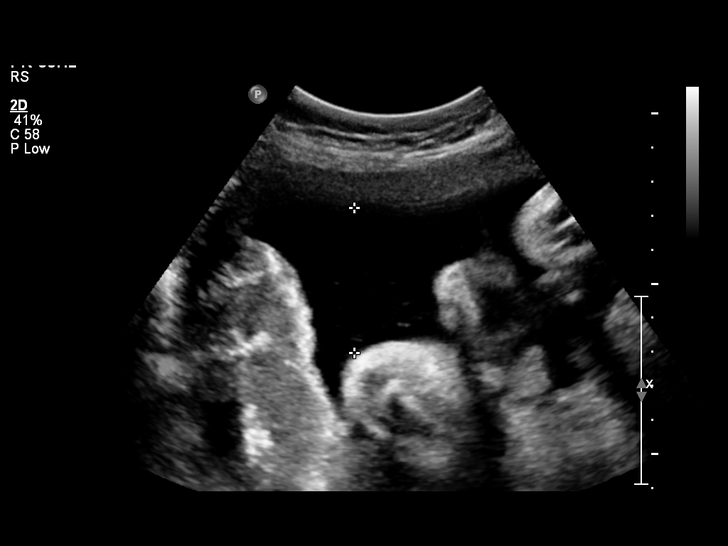
[im 15/41]
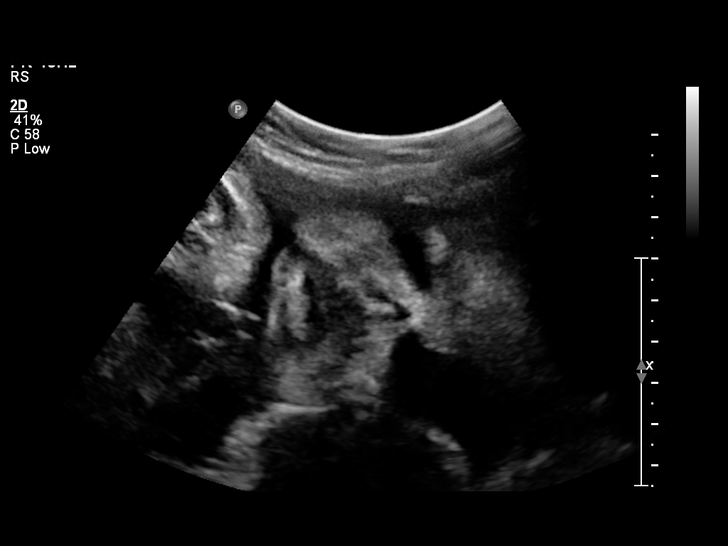
[im 18/41]
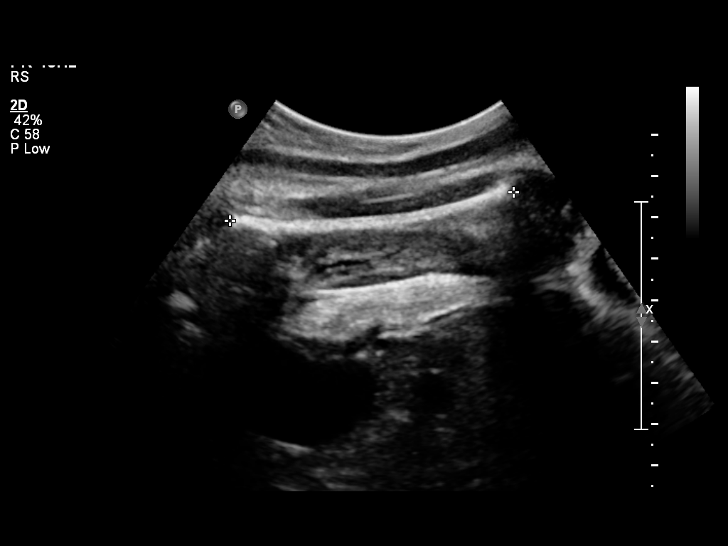
[im 23/41]
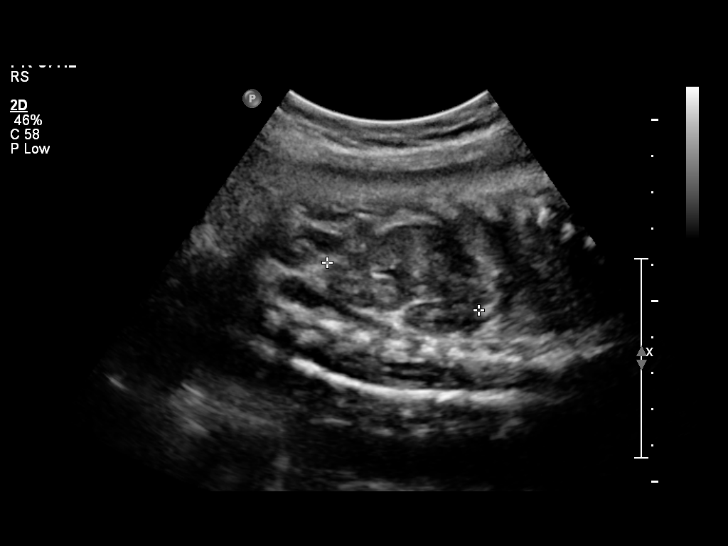
[im 26/41]
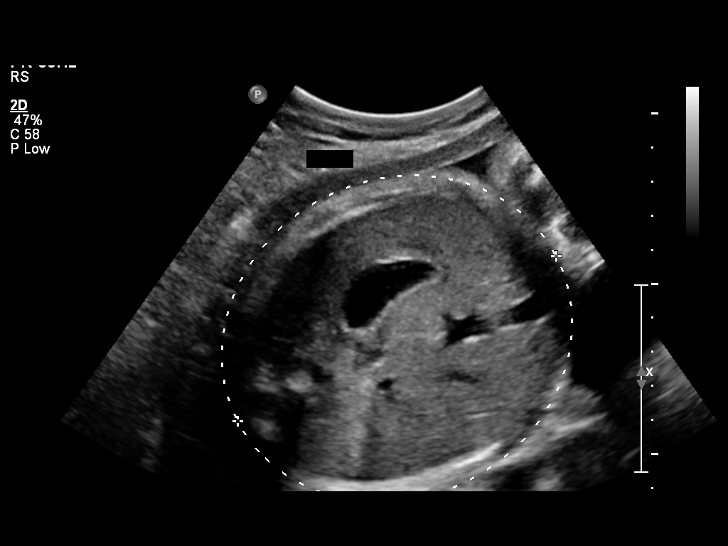
[im 29/41]
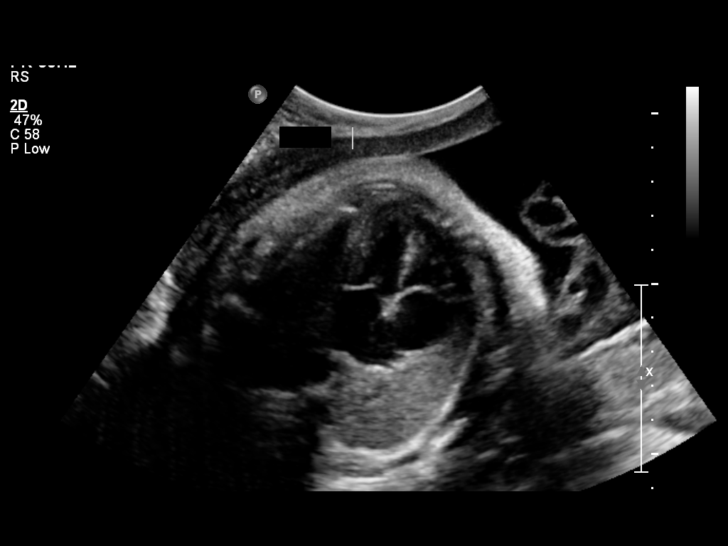
[im 33/41]
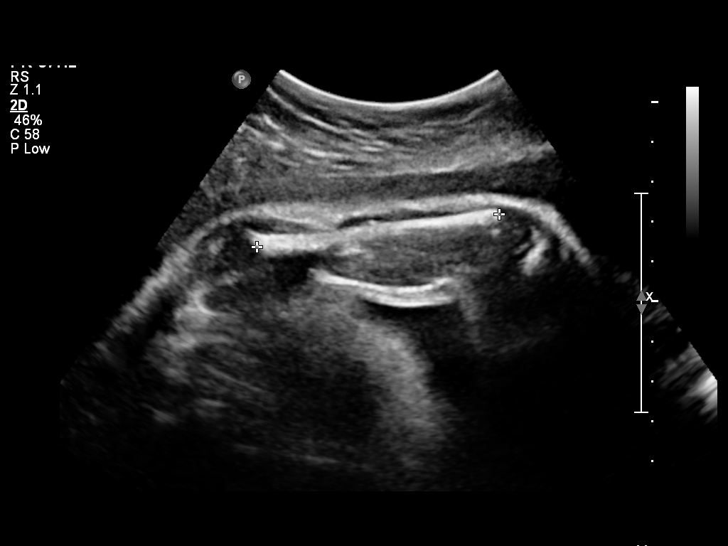
[im 36/41]
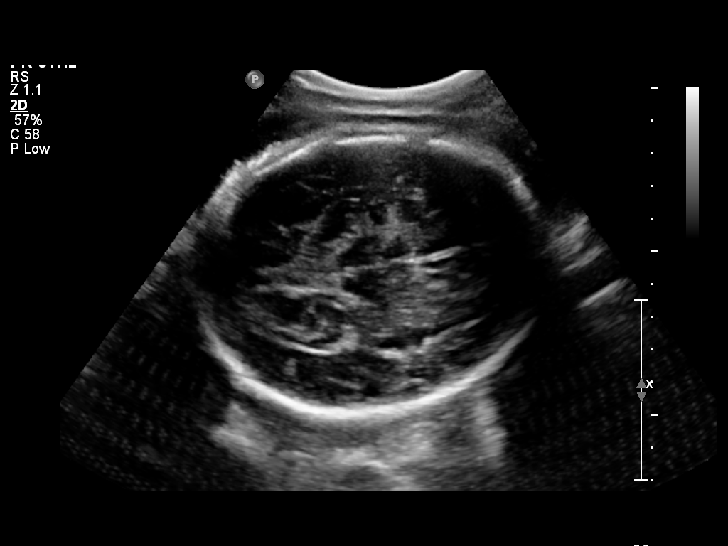
[im 39/41]
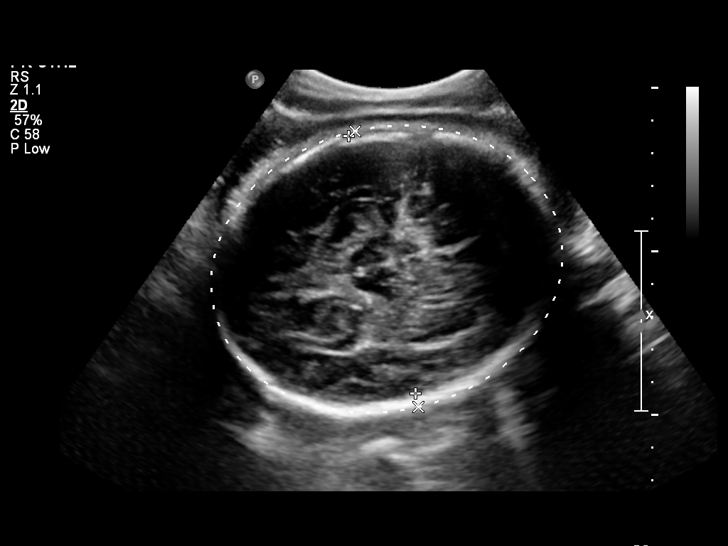

[12 of 28 positions shown; findings below may reference images not displayed]

OBSTETRICS REPORT
                      (Signed Final 05/19/2013 [DATE])

Service(s) Provided

 US OB FOLLOW UP                                       76816.1
Indications

 Advanced maternal age (AMA), Multigravida - low
 risk NIPS
 Size-Date Discrepancy
Fetal Evaluation

 Num Of Fetuses:    1
 Fetal Heart Rate:  135                          bpm
 Cardiac Activity:  Observed
 Presentation:      Cephalic
 Placenta:          Fundal, above cervical os
 P. Cord            Previously Visualized
 Insertion:

 Amniotic Fluid
 AFI FV:      Subjectively within normal limits
 AFI Sum:     15.62   cm       57  %Tile     Larg Pckt:    4.24  cm
 RUQ:   4.04    cm   RLQ:    3.32   cm    LUQ:   4.24    cm   LLQ:    4.02   cm
Biometry

 BPD:     82.3  mm     G. Age:  33w 1d                CI:        70.69   70 - 86
                                                      FL/HC:      21.8   20.1 -

 HC:       312  mm     G. Age:  34w 6d       17  %    HC/AC:      0.99   0.93 -

 AC:     314.9  mm     G. Age:  35w 3d       68  %    FL/BPD:     82.5   71 - 87
 FL:      67.9  mm     G. Age:  34w 6d       40  %    FL/AC:      21.6   20 - 24
 HUM:     61.3  mm     G. Age:  35w 3d       76  %

 Est. FW:    4173  gm    5 lb 10 oz      61  %
Gestational Age

 LMP:           35w 6d        Date:  09/10/12                 EDD:   06/17/13
 U/S Today:     34w 4d                                        EDD:   06/26/13
Anatomy
 Cranium:          Appears normal         Aortic Arch:      Previously seen
 Fetal Cavum:      Appears normal         Ductal Arch:      Previously seen
 Ventricles:       Appears normal         Diaphragm:        Appears normal
 Choroid Plexus:   Previously seen        Stomach:          Appears normal
 Cerebellum:       Previously seen        Abdomen:          Appears normal
 Posterior Fossa:  Previously seen        Abdominal Wall:   Previously seen
 Nuchal Fold:      Previously seen        Cord Vessels:     Previously seen
 Face:             Orbits previously      Kidneys:          Appear normal
                   seen, profile nml
 Lips:             Appears normal         Bladder:          Appears normal
 Heart:            Appears normal         Spine:            Previously seen
                   (4CH, axis, and
                   situs)
 RVOT:             Previously seen        Lower             Previously seen
                                          Extremities:
 LVOT:             Previously seen        Upper             Previously seen
                                          Extremities:

 Other:  Fetus appears to be a male. Heels and 5th digit previously visualized.
         Technically difficult due to fetal position.
Targeted Anatomy

 Fetal Central Nervous System
 Lat. Ventricles:
Cervix Uterus Adnexa

 Cervical Length:    3.5      cm

 Cervix:       Normal appearance by transabdominal scan.
 Uterus:       No abnormality visualized.

 Adnexa:     No abnormality visualized.
Impression

 Single IUP at 35 0/7 weeks
 Fetal growth is appropriate (61st %tile)
 Normal interval anatomy
 Fundal placenta without previa
 Normal amniotic fluid volume
Recommendations

 Follow-up ultrasounds as clinically indicated.

## 2017-04-14 ENCOUNTER — Emergency Department (HOSPITAL_COMMUNITY): Admission: EM | Admit: 2017-04-14 | Discharge: 2017-04-15 | Discharge status: Against Medical Advice | Payer: Self-pay

## 2017-04-14 NOTE — ED Notes (Signed)
Bed: WTR5 Expected date:  Expected time:  Means of arrival:  Comments: 

## 2017-04-14 NOTE — ED Triage Notes (Signed)
Pt called from triage with no answer 

## 2018-01-05 ENCOUNTER — Other Ambulatory Visit: Payer: Self-pay | Admitting: Obstetrics & Gynecology

## 2018-01-05 DIAGNOSIS — Z1231 Encounter for screening mammogram for malignant neoplasm of breast: Secondary | ICD-10-CM

## 2018-08-30 ENCOUNTER — Other Ambulatory Visit: Payer: Self-pay

## 2018-08-30 ENCOUNTER — Emergency Department (HOSPITAL_COMMUNITY)
Admission: EM | Admit: 2018-08-30 | Discharge: 2018-08-30 | Disposition: A | Discharge status: Other Acute Inpatient Hospital | Payer: 59

## 2022-11-11 ENCOUNTER — Other Ambulatory Visit: Payer: Self-pay

## 2022-11-11 ENCOUNTER — Encounter (HOSPITAL_COMMUNITY): Payer: Self-pay | Admitting: Emergency Medicine

## 2022-11-11 ENCOUNTER — Ambulatory Visit (HOSPITAL_COMMUNITY)
Admission: EM | Admit: 2022-11-11 | Discharge: 2022-11-11 | Disposition: A | Discharge status: Home / Self Care | Payer: Self-pay | Attending: Emergency Medicine | Admitting: Emergency Medicine

## 2022-11-11 DIAGNOSIS — K2900 Acute gastritis without bleeding: Secondary | ICD-10-CM

## 2022-11-11 LAB — POCT URINALYSIS DIP (MANUAL ENTRY)
Bilirubin, UA: NEGATIVE
Glucose, UA: NEGATIVE mg/dL
Ketones, POC UA: NEGATIVE mg/dL
Leukocytes, UA: NEGATIVE
Nitrite, UA: NEGATIVE
Protein Ur, POC: NEGATIVE mg/dL
Spec Grav, UA: >=1.03 — AB (ref 1.010–1.025)
Urobilinogen, UA: 0.2 E.U./dL
pH, UA: 5.5 (ref 5.0–8.0)

## 2022-11-11 LAB — POCT URINE PREGNANCY: Preg Test, Ur: NEGATIVE

## 2022-11-11 MED ORDER — PANTOPRAZOLE SODIUM 40 MG PO TBEC: 40.0000 mg | DELAYED_RELEASE_TABLET | Freq: Every day | ORAL | 0 refills | Status: DC

## 2022-11-11 NOTE — ED Triage Notes (Addendum)
Reports abdominal pain for 2 years.  This pain started 2 days ago.  Today pain has been cramp like.  Denies vomiting.  Reports diarrhea stools x 2 .   Pain is center epigastric pain Has had tylenol over the week end

## 2022-11-11 NOTE — Discharge Instructions (Addendum)
Please take the Protonix once daily, every day for the next 2 weeks.  I recommend to take first thing in the morning on an empty stomach.  Sit upright for 30 minutes after taking.  I recommend adjusting your diet to avoid spicy, greasy, fatty, caffeine, etc.  These foods may worsen your symptoms.  You can keep a food log to see which foods may upset your stomach more. Drink lots of water!!  Also avoid alcohol and medications such as ibuprofen/Advil (Tylenol is fine to take)  Please follow closely with your primary care provider.  I recommend you follow-up with a stomach specialist.  If at any point your symptoms worsen or become severe, please go directly to the emergency department.

## 2022-11-11 NOTE — ED Provider Notes (Signed)
[facility]    CSN: [id number] Arrival date & time: 11/11/22  1922      History   Chief Complaint Chief Complaint  Patient presents with   Abdominal Pain    HPI Kylie Harvey is a 47 y.o. female.  Abd pain on and off for 2 years. This episode started 2 days ago, she woke up with it. Worse the first day and then got better. Not having any symptoms or pain right now. Reports used to drink a lot but has cut down. Used to take advil a lot as well. Increased stress recently   No nausea or vomiting.  Some softer stools for the last few days.  No blood in stool, no dark or tarry stool. No fever Denies urinary symptoms  History reviewed. No pertinent past medical history.  Patient Active Problem List   Diagnosis Date Noted   Active labor 06/14/2013    History reviewed. No pertinent surgical history.  OB History     Gravida  5   Para  1   Term  1   Preterm      AB  1   Living  4      SAB  1   IAB      Ectopic      Multiple      Live Births  1            Home Medications    Prior to Admission medications   Medication Sig Start Date End Date Taking? Authorizing Provider  pantoprazole (PROTONIX) 40 MG tablet Take 1 tablet (40 mg total) by mouth daily. 11/11/22  Yes Letesha Klecker, Lurena Joiner, PA-C  ibuprofen (ADVIL,MOTRIN) 600 MG tablet Take 1 tablet (600 mg total) by mouth every 6 (six) hours. Patient not taking: Reported on 11/11/2022 06/15/13   Pior, Shanda Bumps, MD  Prenatal Vit-Fe Fumarate-FA (PRENATAL MULTIVITAMIN) TABS Take 1 tablet by mouth daily.  Patient not taking: Reported on 11/11/2022    [provider]    Family History History reviewed. No pertinent family history.  Social History Social History   Tobacco Use   Smoking status: Never  Vaping Use   Vaping Use: Never used  Substance Use Topics   Alcohol use: Not Currently   Drug use: Yes    Types: Marijuana    Comment: +uds for marijuana 11/28/2012. patient states that  she have stopped     Allergies   Patient has no known allergies.   Review of Systems Review of Systems As per HPI  Physical Exam Triage Vital Signs ED Triage Vitals  Enc Vitals Group     BP 11/11/22 1946 128/74     Pulse Rate 11/11/22 1946 79     Resp 11/11/22 1946 20     Temp 11/11/22 1946 98.9 F (37.2 C)     Temp Source 11/11/22 1946 Oral     SpO2 11/11/22 1946 97 %     Weight --      Height --      Head Circumference --      Peak Flow --      Pain Score 11/11/22 1943 0     Pain Loc --      Pain Edu? --      Excl. in GC? --    No data found.  Updated Vital Signs BP 128/74 (BP Location: Left Arm) Comment (BP Location): large cuff  Pulse 79   Temp 98.9 F (37.2 C) (Oral)   Resp 20  SpO2 97%   Visual Acuity Right Eye Distance:   Left Eye Distance:   Bilateral Distance:    Right Eye Near:   Left Eye Near:    Bilateral Near:     Physical Exam Vitals and nursing note reviewed.  Constitutional:      Appearance: Normal appearance.  HENT:     Mouth/Throat:     Mouth: Mucous membranes are moist.     Pharynx: Oropharynx is clear.  Eyes:     Conjunctiva/sclera: Conjunctivae normal.  Cardiovascular:     Rate and Rhythm: Normal rate and regular rhythm.     Heart sounds: Normal heart sounds.  Pulmonary:     Effort: Pulmonary effort is normal. No respiratory distress.     Breath sounds: Normal breath sounds.  Abdominal:     General: Bowel sounds are normal.     Palpations: Abdomen is soft.     Tenderness: There is no abdominal tenderness. There is no right CVA tenderness, left CVA tenderness, guarding or rebound.  Musculoskeletal:        General: Normal range of motion.  Skin:    General: Skin is warm and dry.  Neurological:     Mental Status: She is alert and oriented to person, place, and time.      UC Treatments / Results  Labs (all labs ordered are listed, but only abnormal results are displayed) Labs Reviewed  POCT URINALYSIS DIP (MANUAL  ENTRY) - Abnormal; Notable for the following components:      Result Value   Spec Grav, UA >=1.030 (*)    Blood, UA trace-intact (*)    All other components within normal limits  POCT URINE PREGNANCY    EKG   Radiology No results found.  Procedures Procedures (including critical care time)  Medications Ordered in UC Medications - No data to display  Initial Impression / Assessment and Plan / UC Course  I have reviewed the triage vital signs and the nursing notes.  Pertinent labs & imaging results that were available during my care of the patient were reviewed by me and considered in my medical decision making (see chart for details).  UPT negative UA elevated specific gravity. Trace RBCs Advised to increase fluids  Discussed could be gastritis vs esophagitis vs ulcer etc Protonix daily x 14 days. Diet changes, food log, lifestyle changes. Follow close with PCP and GI. Return and ED precautions   Final Clinical Impressions(s) / UC Diagnoses   Final diagnoses:  Other acute gastritis without hemorrhage     Discharge Instructions      Please take the Protonix once daily, every day for the next 2 weeks.  I recommend to take first thing in the morning on an empty stomach.  Sit upright for 30 minutes after taking.  I recommend adjusting your diet to avoid spicy, greasy, fatty, caffeine, etc.  These foods may worsen your symptoms.  You can keep a food log to see which foods may upset your stomach more. Drink lots of water!!  Also avoid alcohol and medications such as ibuprofen/Advil (Tylenol is fine to take)  Please follow closely with your primary care provider.  I recommend you follow-up with a stomach specialist.  If at any point your symptoms worsen or become severe, please go directly to the emergency department.    ED Prescriptions     Medication Sig Dispense Auth. Provider   pantoprazole (PROTONIX) 40 MG tablet Take 1 tablet (40 mg total) by mouth daily. 14  tablet Keir Viernes, Lurena Joiner, PA-C      PDMP not reviewed this encounter.

## 2023-09-25 ENCOUNTER — Encounter (HOSPITAL_COMMUNITY): Payer: Self-pay

## 2023-09-25 ENCOUNTER — Ambulatory Visit (HOSPITAL_COMMUNITY)
Admission: EM | Admit: 2023-09-25 | Discharge: 2023-09-25 | Disposition: A | Discharge status: Home / Self Care | Payer: Self-pay | Attending: Emergency Medicine | Admitting: Emergency Medicine

## 2023-09-25 DIAGNOSIS — R519 Headache, unspecified: Secondary | ICD-10-CM

## 2023-09-25 DIAGNOSIS — M6289 Other specified disorders of muscle: Secondary | ICD-10-CM

## 2023-09-25 MED ORDER — CYCLOBENZAPRINE HCL 10 MG PO TABS: 10.0000 mg | ORAL_TABLET | Freq: Two times a day (BID) | ORAL | 0 refills | Status: AC | PRN
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
## 2023-09-25 NOTE — Discharge Instructions (Addendum)
 You can take the muscle relaxer Flexeril twice daily. If the medication makes you drowsy, take only at bed time. Alternate tylenol and ibuprofen for stiffness/pain You can apply hot pad and try gentle stretching If you develop neck or shoulder pain over the next several days, follow with orthopedics
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
## 2024-05-17 ENCOUNTER — Other Ambulatory Visit: Payer: Self-pay

## 2024-05-17 ENCOUNTER — Ambulatory Visit (HOSPITAL_COMMUNITY)
Admission: EM | Admit: 2024-05-17 | Discharge: 2024-05-17 | Disposition: A | Discharge status: Home / Self Care | Payer: Self-pay | Source: Ambulatory Visit | Attending: Family Medicine | Admitting: Family Medicine

## 2024-05-17 ENCOUNTER — Encounter (HOSPITAL_COMMUNITY): Payer: Self-pay | Admitting: *Deleted

## 2024-05-17 DIAGNOSIS — K0889 Other specified disorders of teeth and supporting structures: Secondary | ICD-10-CM

## 2024-05-17 MED ORDER — HYDROCODONE-ACETAMINOPHEN 5-325 MG PO TABS: 1.0000 | ORAL_TABLET | Freq: Four times a day (QID) | ORAL | 0 refills | Status: AC | PRN

## 2024-05-17 MED ORDER — AMOXICILLIN-POT CLAVULANATE 875-125 MG PO TABS: 1.0000 | ORAL_TABLET | Freq: Two times a day (BID) | ORAL | 0 refills | Status: AC

## 2024-05-17 NOTE — ED Provider Notes (Signed)
  [facility]   [id number] 05/17/24 Arrival Time: 1437  ASSESSMENT & PLAN:  1. Pain, dental    Begin: Meds ordered this encounter  Medications   amoxicillin -clavulanate (AUGMENTIN ) 875-125 MG tablet    Sig: Take 1 tablet by mouth every 12 (twelve) hours.    Dispense:  14 tablet    Refill:  0   HYDROcodone -acetaminophen  (NORCO/VICODIN) 5-325 MG tablet    Sig: Take 1 tablet by mouth every 6 (six) hours as needed for moderate pain (pain score 4-6) or severe pain (pain score 7-10).    Dispense:  8 tablet    Refill:  0    [place] Controlled Substances Registry consulted for this patient. I feel the risk/benefit ratio today is favorable for proceeding with this prescription for a controlled substance. Medication sedation precautions given.   Follow-up Information     Kylie Harvey at [facility].   Specialty: Emergency Medicine Why: If worsening or failing to improve as anticipated. Contact information: [address] [place]  [address] [phone]               Work note provided.  Reviewed expectations re: course of current medical issues. Questions answered. Outlined signs and symptoms indicating need for more acute intervention. Patient verbalized understanding. After Visit Summary given.   SUBJECTIVE:  Kylie Harvey is a 48 y.o. female who reports dental pain; x 2 days; behind front upper left teeth. Denies bleeding/drainage. Denies fever. Tolerating PO intake. No tx PTA.   OBJECTIVE: Vitals:   05/17/24 1513  BP: (!) 141/88  Pulse: 60  Resp: 18  Temp: 98.8 F (37.1 C)  SpO2: 97%    General appearance: alert; no distress HENT: normocephalic; atraumatic; dentition: fair; TTP behind upper left front teeth; firm; without fluctuance Neck: supple without LAD; FROM; trachea midline Lungs: normal respirations; unlabored; speaks full sentences without difficulty Skin: warm and dry Psychological: alert  and cooperative; normal mood and affect  No Known Allergies  History reviewed. No pertinent past medical history. Social History   Socioeconomic History   Marital status: Single    Spouse name: Not on file   Number of children: Not on file   Years of education: Not on file   Highest education level: Not on file  Occupational History   Not on file  Tobacco Use   Smoking status: Never   Smokeless tobacco: Not on file  Vaping Use   Vaping status: Never Used  Substance and Sexual Activity   Alcohol use: Not Currently   Drug use: Yes    Types: Marijuana    Comment: +uds for marijuana 11/28/2012. patient states that she have stopped   Sexual activity: Not Currently    Birth control/protection: None  Other Topics Concern   Not on file  Social History Narrative   Not on file   Social Drivers of Health   Financial Resource Strain: Not on file  Food Insecurity: Not on file  Transportation Needs: Not on file  Physical Activity: Not on file  Stress: Not on file  Social Connections: Not on file  Intimate Partner Violence: Not on file   History reviewed. No pertinent family history. History reviewed. No pertinent surgical history.    Rolinda Rogue, MD 05/17/24 [phone]

## 2024-05-17 NOTE — Discharge Instructions (Signed)
 Be aware, you have been prescribed pain medications that may cause drowsiness. While taking this medication, do not take any other medications containing acetaminophen (Tylenol). Do not combine with alcohol or recreational drugs. Please do not drive, operate heavy machinery, or take part in activities that require making important decisions while on this medication as your judgement may be clouded.

## 2024-05-17 NOTE — ED Triage Notes (Signed)
 PT reports for 2 days she has had an abscess on Lt upper gum. PT has been taking tylenol  fo rpain.
